# Patient Record
Sex: Male | Born: 1940 | Race: Black or African American | Hispanic: No | Marital: Married | Smoking: Former smoker
Health system: Southern US, Community
[De-identification: ages and names within clinical notes are randomized; demographics above are authoritative.]

## PROBLEM LIST (undated history)

## (undated) DIAGNOSIS — I10 Essential (primary) hypertension: Secondary | ICD-10-CM

---

## 1999-05-31 ENCOUNTER — Other Ambulatory Visit: Admission: RE | Admit: 1999-05-31 | Discharge: 1999-05-31 | Payer: Self-pay | Admitting: Urology

## 1999-06-22 ENCOUNTER — Encounter: Payer: Self-pay | Admitting: Urology

## 1999-06-25 ENCOUNTER — Encounter (INDEPENDENT_AMBULATORY_CARE_PROVIDER_SITE_OTHER): Payer: Self-pay

## 1999-06-25 ENCOUNTER — Inpatient Hospital Stay (HOSPITAL_COMMUNITY): Admission: RE | Admit: 1999-06-25 | Discharge: 1999-06-28 | Payer: Self-pay | Admitting: Urology

## 2003-07-10 ENCOUNTER — Emergency Department (HOSPITAL_COMMUNITY): Admission: EM | Admit: 2003-07-10 | Discharge: 2003-07-10 | Payer: Self-pay | Admitting: Emergency Medicine

## 2005-05-05 ENCOUNTER — Emergency Department (HOSPITAL_COMMUNITY): Admission: EM | Admit: 2005-05-05 | Discharge: 2005-05-05 | Payer: Self-pay | Admitting: Emergency Medicine

## 2007-07-09 ENCOUNTER — Encounter: Admission: RE | Admit: 2007-07-09 | Discharge: 2007-07-09 | Payer: Self-pay | Admitting: Internal Medicine

## 2009-08-20 ENCOUNTER — Emergency Department (HOSPITAL_COMMUNITY): Admission: EM | Admit: 2009-08-20 | Discharge: 2009-08-20 | Payer: Self-pay | Admitting: Emergency Medicine

## 2013-03-10 ENCOUNTER — Other Ambulatory Visit: Payer: Self-pay | Admitting: Internal Medicine

## 2013-03-10 DIAGNOSIS — Z136 Encounter for screening for cardiovascular disorders: Secondary | ICD-10-CM

## 2013-03-17 ENCOUNTER — Ambulatory Visit
Admission: RE | Admit: 2013-03-17 | Discharge: 2013-03-17 | Disposition: A | Discharge status: Home / Self Care | Payer: Medicare Other | Source: Ambulatory Visit | Attending: Internal Medicine | Admitting: Internal Medicine

## 2013-03-17 DIAGNOSIS — I10 Essential (primary) hypertension: Secondary | ICD-10-CM

## 2013-03-17 DIAGNOSIS — Z136 Encounter for screening for cardiovascular disorders: Secondary | ICD-10-CM

## 2013-03-17 HISTORY — DX: Essential (primary) hypertension: I10

## 2014-03-29 ENCOUNTER — Other Ambulatory Visit: Payer: Self-pay | Admitting: Internal Medicine

## 2014-03-29 DIAGNOSIS — R413 Other amnesia: Secondary | ICD-10-CM

## 2014-04-27 ENCOUNTER — Other Ambulatory Visit: Payer: Medicare Other

## 2014-05-06 ENCOUNTER — Ambulatory Visit
Admission: RE | Admit: 2014-05-06 | Discharge: 2014-05-06 | Disposition: A | Discharge status: Home / Self Care | Payer: Medicare Other | Source: Ambulatory Visit | Attending: Internal Medicine | Admitting: Internal Medicine

## 2014-05-06 DIAGNOSIS — R413 Other amnesia: Secondary | ICD-10-CM

## 2014-11-03 ENCOUNTER — Other Ambulatory Visit (HOSPITAL_COMMUNITY): Payer: Self-pay | Admitting: Internal Medicine

## 2014-11-03 DIAGNOSIS — I639 Cerebral infarction, unspecified: Secondary | ICD-10-CM

## 2014-11-08 ENCOUNTER — Ambulatory Visit (HOSPITAL_BASED_OUTPATIENT_CLINIC_OR_DEPARTMENT_OTHER)
Admission: RE | Admit: 2014-11-08 | Discharge: 2014-11-08 | Disposition: A | Discharge status: Home / Self Care | Payer: Medicare Other | Source: Ambulatory Visit | Attending: Internal Medicine | Admitting: Internal Medicine

## 2014-11-08 ENCOUNTER — Ambulatory Visit (HOSPITAL_COMMUNITY)
Admission: RE | Admit: 2014-11-08 | Discharge: 2014-11-08 | Disposition: A | Discharge status: Home / Self Care | Payer: Medicare Other | Source: Ambulatory Visit | Attending: Internal Medicine | Admitting: Internal Medicine

## 2014-11-08 DIAGNOSIS — I639 Cerebral infarction, unspecified: Secondary | ICD-10-CM

## 2014-11-08 DIAGNOSIS — Z8673 Personal history of transient ischemic attack (TIA), and cerebral infarction without residual deficits: Secondary | ICD-10-CM | POA: Diagnosis not present

## 2014-11-08 DIAGNOSIS — I10 Essential (primary) hypertension: Secondary | ICD-10-CM | POA: Diagnosis not present

## 2014-11-08 NOTE — Progress Notes (Signed)
VASCULAR LAB PRELIMINARY  PRELIMINARY  PRELIMINARY  PRELIMINARY  Carotid Duplex completed.    Preliminary report:  Bilateral:  1-39% ICA stenosis.  Vertebral artery flow is antegrade.    Chauncy Lean, RVT 11/08/2014, 2:43 PM

## 2014-11-08 NOTE — Progress Notes (Signed)
*  PRELIMINARY RESULTS* Echocardiogram 2D Echocardiogram has been performed.  Jerry Spencer 11/08/2014, 2:06 PM

## 2016-02-06 ENCOUNTER — Emergency Department (HOSPITAL_COMMUNITY)
Admission: EM | Admit: 2016-02-06 | Discharge: 2016-02-06 | Disposition: A | Discharge status: Home / Self Care | Payer: Medicare Other | Attending: Emergency Medicine | Admitting: Emergency Medicine

## 2016-02-06 ENCOUNTER — Encounter (HOSPITAL_COMMUNITY): Payer: Self-pay

## 2016-02-06 DIAGNOSIS — S39012A Strain of muscle, fascia and tendon of lower back, initial encounter: Secondary | ICD-10-CM | POA: Diagnosis not present

## 2016-02-06 DIAGNOSIS — Z87891 Personal history of nicotine dependence: Secondary | ICD-10-CM | POA: Insufficient documentation

## 2016-02-06 DIAGNOSIS — Z8546 Personal history of malignant neoplasm of prostate: Secondary | ICD-10-CM | POA: Insufficient documentation

## 2016-02-06 DIAGNOSIS — Y999 Unspecified external cause status: Secondary | ICD-10-CM | POA: Insufficient documentation

## 2016-02-06 DIAGNOSIS — Z7982 Long term (current) use of aspirin: Secondary | ICD-10-CM | POA: Insufficient documentation

## 2016-02-06 DIAGNOSIS — Y9389 Activity, other specified: Secondary | ICD-10-CM | POA: Insufficient documentation

## 2016-02-06 DIAGNOSIS — Y929 Unspecified place or not applicable: Secondary | ICD-10-CM | POA: Insufficient documentation

## 2016-02-06 DIAGNOSIS — X500XXA Overexertion from strenuous movement or load, initial encounter: Secondary | ICD-10-CM | POA: Insufficient documentation

## 2016-02-06 DIAGNOSIS — Z79899 Other long term (current) drug therapy: Secondary | ICD-10-CM | POA: Insufficient documentation

## 2016-02-06 DIAGNOSIS — I10 Essential (primary) hypertension: Secondary | ICD-10-CM | POA: Diagnosis not present

## 2016-02-06 DIAGNOSIS — S3992XA Unspecified injury of lower back, initial encounter: Secondary | ICD-10-CM | POA: Diagnosis present

## 2016-02-06 MED ORDER — NAPROXEN 500 MG PO TABS: 500.0000 mg | ORAL_TABLET | Freq: Two times a day (BID) | ORAL | 0 refills | Status: AC

## 2016-02-06 NOTE — ED Triage Notes (Signed)
Pt says he climbed three stories of steps on Saturday and then his lower back started to hurt

## 2016-02-06 NOTE — ED Provider Notes (Signed)
[facility]-EMERGENCY DEPT Provider Note   CSN: [id number] Arrival date & time: 02/06/16  1928   By signing my name below, I, Jerry Spencer, attest that this documentation has been prepared under the direction and in the presence of Jerry ReamAlexandra Rasean Joos, PA-C. Electronically Signed: Christel MormonMatthew Spencer, Scribe. 02/06/2016. 8:28 PM.   History   Chief Complaint Chief Complaint  Patient presents with  . Back Pain   The history is provided by the patient. No language interpreter was used.   HPI Comments:  Jerry Spencer is a 75 y.o. male with PMHx of HTN and prostate cancer 10 years ago who presents to the Emergency Department complaining of constant L-sided lower back pain x3 days. Pt states that he climbed 3 flights of stairs carrying heavy water and then the pain began the next morning. Pt rates the pain at 5/10 and describes it as "soreness." He has had some pain shooting down his left leg, only with movement. Pt has not taken anything for relief. Pt denies numbness, tingling, fever, bowel/bladder incontinence, saddle anesthesia, chest pain, SOB, nausea, vomiting, dysuria, or other urinary symptoms.   Past Medical History:  Diagnosis Date  . HTN (hypertension)     Patient Active Problem List   Diagnosis Date Noted  . HTN (hypertension)     History reviewed. No pertinent surgical history.     Home Medications    Prior to Admission medications   Medication Sig Start Date End Date Taking? Authorizing Provider  aspirin 81 MG tablet Take 81 mg by mouth daily.    Historical Provider, MD  chlorthalidone (HYGROTON) 25 MG tablet Take 25 mg by mouth daily.    Historical Provider, MD  naproxen (NAPROSYN) 500 MG tablet Take 1 tablet (500 mg total) by mouth 2 (two) times daily. 02/06/16 02/10/16  Waylan BogaAlexandra M [name], PA-C  potassium chloride (KLOR-CON) 20 MEQ packet Take 20 mEq by mouth 2 (two) times daily.    Historical Provider, MD    Family History History reviewed. No pertinent family  history.  Social History Social History  Substance Use Topics  . Smoking status: Former Smoker    Packs/day: 0.50    Years: 10.00    Quit date: 05/20/1973  . Smokeless tobacco: Never Used     Comment: Quit smoking habit 1975, still smokes occasionally  . Alcohol use No     Allergies   Review of patient's allergies indicates not on file.   Review of Systems Review of Systems  Constitutional: Negative for chills and fever.  HENT: Negative for facial swelling and sore throat.   Respiratory: Negative for shortness of breath.   Cardiovascular: Negative for chest pain.  Gastrointestinal: Negative for abdominal pain, nausea and vomiting.  Genitourinary: Negative for dysuria.  Musculoskeletal: Positive for back pain.  Skin: Negative for rash and wound.  Neurological: Negative for headaches.  Psychiatric/Behavioral: The patient is not nervous/anxious.      Physical Exam Updated Vital Signs BP 143/73 (BP Location: Right Arm)   Pulse 62   Temp 98.3 F (36.8 C) (Oral)   Resp 16   SpO2 100%   Physical Exam  Constitutional: He appears well-developed and well-nourished. No distress.  HENT:  Head: Normocephalic and atraumatic.  Mouth/Throat: Oropharynx is clear and moist. No oropharyngeal exudate.  Eyes: Conjunctivae are normal. Pupils are equal, round, and reactive to light. Right eye exhibits no discharge. Left eye exhibits no discharge. No scleral icterus.  Neck: Normal range of motion. Neck supple. No thyromegaly present.  Cardiovascular: Normal  rate, regular rhythm, normal heart sounds and intact distal pulses.  Exam reveals no gallop and no friction rub.   No murmur heard. Pulmonary/Chest: Effort normal and breath sounds normal. No stridor. No respiratory distress. He has no wheezes. He has no rales.  Abdominal: Soft. Bowel sounds are normal. He exhibits no distension and no pulsatile midline mass. There is no tenderness. There is no rebound, no guarding and no CVA  tenderness.  Musculoskeletal: He exhibits no edema.       Lumbar back: He exhibits tenderness and spasm. He exhibits no bony tenderness.       Back:  No tenderness to palpation to spine  Lymphadenopathy:    He has no cervical adenopathy.  Neurological: He is alert. Coordination normal.  Reflex Scores:      Patellar reflexes are 2+ on the right side and 2+ on the left side. 5/5 strength to lower extremities; normal sensation; patient ambulatory without difficulty in the ED  Skin: Skin is warm and dry. No rash noted. He is not diaphoretic. No pallor.  Psychiatric: He has a normal mood and affect.  Nursing note and vitals reviewed.    ED Treatments / Results  DIAGNOSTIC STUDIES:  Oxygen Saturation is 100% on RA, normal by my interpretation.    COORDINATION OF CARE:  8:28 PM Discussed treatment plan with pt at bedside and pt agreed to plan.   Labs (all labs ordered are listed, but only abnormal results are displayed) Labs Reviewed - No data to display  EKG  EKG Interpretation None       Radiology No results found.  Procedures Procedures (including critical care time)  Medications Ordered in ED Medications - No data to display   Initial Impression / Assessment and Plan / ED Course  I have reviewed the triage vital signs and the nursing notes.  Pertinent labs & imaging results that were available during my care of the patient were reviewed by me and considered in my medical decision making (see chart for details).  Clinical Course    Patient with back pain after mechanism of injury.  No neurological deficits and normal neuro exam.  Patient is ambulatory.  No loss of bowel or bladder control.  No concern for cauda equina.  No fever, night sweats, weight loss, h/o cancer, IVDA, no recent procedure to back. No urinary symptoms suggestive of UTI.  Patient discharged with short course of Naprosyn. Supportive care, including heat and stretches, and return precaution  discussed. Patient to follow up with PCP. Appears safe for discharge at this time. Patient also evaluated by Dr. [name] who agrees with plan.  Final Clinical Impressions(s) / ED Diagnoses   Final diagnoses:  Lumbar strain, initial encounter    New Prescriptions Discharge Medication List as of 02/06/2016  9:02 PM    START taking these medications   Details  naproxen (NAPROSYN) 500 MG tablet Take 1 tablet (500 mg total) by mouth 2 (two) times daily., Starting Tue 02/06/2016, Until Sat 02/10/2016, Print      I personally performed the services described in this documentation, which was scribed in my presence. The recorded information has been reviewed and is accurate.     Emi Holes, PA-C 02/06/16 2207    Lyndal Pulley, MD 02/07/16 1322

## 2016-02-06 NOTE — ED Triage Notes (Signed)
Pt complains of left side pain since last night, no trouble urinating, and no injury

## 2016-02-06 NOTE — Discharge Instructions (Signed)
Medications: Naprosyn  Treatment: Take Naprosyn as prescribed for 4 days. Use moist heat 3-4 times daily alternating 20 minutes on, 20 minutes off.  Follow-up: Please follow-up with your primary care provider for follow-up of today's visit and further evaluation and treatment of your symptoms as needed. Please return to emergency department if you develop any new or worsening symptoms.

## 2016-02-06 NOTE — Progress Notes (Signed)
Pt states he lifted a container of water for his daughter and carried it up 30 stairs. He then started to have low back pain and pain and pain shooting down his left leg.Pain is a 5/10.

## 2018-05-06 ENCOUNTER — Encounter: Payer: Self-pay | Admitting: Neurology

## 2018-07-22 ENCOUNTER — Other Ambulatory Visit: Payer: Self-pay

## 2018-07-22 ENCOUNTER — Ambulatory Visit: Payer: Medicare Other | Admitting: Neurology

## 2018-07-22 ENCOUNTER — Encounter: Payer: Self-pay | Admitting: Neurology

## 2018-07-22 VITALS — BP 94/68 | HR 64 | Ht 70.0 in | Wt 215.0 lb

## 2018-07-22 DIAGNOSIS — F0390 Unspecified dementia without behavioral disturbance: Secondary | ICD-10-CM | POA: Diagnosis not present

## 2018-07-22 DIAGNOSIS — R292 Abnormal reflex: Secondary | ICD-10-CM | POA: Diagnosis not present

## 2018-07-22 DIAGNOSIS — F0391 Unspecified dementia with behavioral disturbance: Secondary | ICD-10-CM | POA: Diagnosis not present

## 2018-07-22 DIAGNOSIS — R413 Other amnesia: Secondary | ICD-10-CM

## 2018-07-22 DIAGNOSIS — F03918 Unspecified dementia, unspecified severity, with other behavioral disturbance: Secondary | ICD-10-CM

## 2018-07-22 DIAGNOSIS — F03A0 Unspecified dementia, mild, without behavioral disturbance, psychotic disturbance, mood disturbance, and anxiety: Secondary | ICD-10-CM

## 2018-07-22 NOTE — Patient Instructions (Addendum)
1. Schedule MRI brain without contrast  We have sent a referral to [facility] for your MRI and they will call you directly to schedule your appt. They are located at [address] [facility]. If you need to contact them directly please call [phone].  2. Refer to Dr. Jacquelyne Balint for repeat Neurocognitive testing (prior testing in 2017) 3. Continue all your medications and have your wife help 4. Continue to monitor driving 5. Follow-up in 6 months  FALL PRECAUTIONS: Be cautious when walking. Scan the area for obstacles that may increase the risk of trips and falls. When getting up in the mornings, sit up at the edge of the bed for a few minutes before getting out of bed. Consider elevating the bed at the head end to avoid drop of blood pressure when getting up. Walk always in a well-lit room (use night lights in the walls). Avoid area rugs or power cords from appliances in the middle of the walkways. Use a walker or a cane if necessary and consider physical therapy for balance exercise. Get your eyesight checked regularly.  HOME SAFETY: Consider the safety of the kitchen when operating appliances like stoves, microwave oven, and blender. Consider having supervision and share cooking responsibilities until no longer able to participate in those. Accidents with firearms and other hazards in the house should be identified and addressed as well.  DRIVING: Regarding driving, in patients with progressive memory problems, driving will be impaired. We advise to have someone else do the driving if trouble finding directions or if minor accidents are reported. Independent driving assessment is available to determine safety of driving.  ABILITY TO BE LEFT ALONE: If patient is unable to contact 911 operator, consider using LifeLine, or when the need is there, arrange for someone to stay with patients. Smoking is a fire hazard, consider supervision or cessation. Risk of wandering should be assessed by  caregiver and if detected at any point, supervision and safe proof recommendations should be instituted.  MEDICATION SUPERVISION: Inability to self-administer medication needs to be constantly addressed. Implement a mechanism to ensure safe administration of the medications.  RECOMMENDATIONS FOR ALL PATIENTS WITH MEMORY PROBLEMS: 1. Continue to exercise (Recommend 30 minutes of walking everyday, or 3 hours every week) 2. Increase social interactions - continue going to [facility] and enjoy social gatherings with friends and family 3. Eat healthy, avoid fried foods and eat more fruits and vegetables 4. Maintain adequate blood pressure, blood sugar, and blood cholesterol level. Reducing the risk of stroke and cardiovascular disease also helps promoting better memory. 5. Avoid stressful situations. Live a simple life and avoid aggravations. Organize your time and prepare for the next day in anticipation. 6. Sleep well, avoid any interruptions of sleep and avoid any distractions in the bedroom that may interfere with adequate sleep quality 7. Avoid sugar, avoid sweets as there is a strong link between excessive sugar intake, diabetes, and cognitive impairment The Mediterranean diet has been shown to help patients reduce the risk of progressive memory disorders and reduces cardiovascular risk. This includes eating fish, eat fruits and green leafy vegetables, nuts like almonds and hazelnuts, walnuts, and also use olive oil. Avoid fast foods and fried foods as much as possible. Avoid sweets and sugar as sugar use has been linked to worsening of memory function.  There is always a concern of gradual progression of memory problems. If this is the case, then we may need to adjust level of care according to patient needs. Support, both  to the patient and caregiver, should then be put into place.
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
## 2018-07-28 ENCOUNTER — Ambulatory Visit
Admission: RE | Admit: 2018-07-28 | Discharge: 2018-07-28 | Disposition: A | Discharge status: Home / Self Care | Payer: Medicare Other | Source: Ambulatory Visit | Attending: Neurology | Admitting: Neurology

## 2018-07-28 DIAGNOSIS — R413 Other amnesia: Secondary | ICD-10-CM

## 2018-07-29 ENCOUNTER — Telehealth: Payer: Self-pay

## 2018-07-29 NOTE — Telephone Encounter (Signed)
-----   Message from Van Clines, MD sent at 07/28/2018  2:02 PM EDT ----- Pls let patient/wife know the MRI did not show any evidence of tumor, stroke, or bleed. It showed age-related changes. Pls fax report to his PCP as well, thanks

## 2018-07-29 NOTE — Telephone Encounter (Signed)
Patient left vm with afterhours about returning your call. Thanks

## 2018-07-29 NOTE — Telephone Encounter (Signed)
LMOM asking for return call to relay results below.    MRI report faxed to Dr. Valentina Lucks at [phone]

## 2018-07-30 NOTE — Telephone Encounter (Signed)
Returned call to pt.  Relayed results below.

## 2019-02-09 ENCOUNTER — Ambulatory Visit: Payer: Medicare Other | Admitting: Neurology

## 2019-02-26 ENCOUNTER — Ambulatory Visit: Payer: Medicare Other | Admitting: Neurology

## 2019-02-26 ENCOUNTER — Encounter: Payer: Self-pay | Admitting: Neurology

## 2019-02-26 ENCOUNTER — Other Ambulatory Visit: Payer: Self-pay

## 2019-02-26 VITALS — BP 126/60 | HR 72 | Ht 70.0 in | Wt 219.4 lb

## 2019-02-26 DIAGNOSIS — F03B18 Unspecified dementia, moderate, with other behavioral disturbance: Secondary | ICD-10-CM

## 2019-02-26 DIAGNOSIS — F0391 Unspecified dementia with behavioral disturbance: Secondary | ICD-10-CM | POA: Diagnosis not present

## 2019-02-26 MED ORDER — MEMANTINE HCL 10 MG PO TABS: ORAL_TABLET | ORAL | 11 refills | Status: DC

## 2019-02-26 MED ORDER — DONEPEZIL HCL 10 MG PO TABS: ORAL_TABLET | ORAL | 3 refills | Status: AC

## 2019-02-26 NOTE — Patient Instructions (Signed)
1. Start Namenda 10mg : take 1 tablet every night for 2 weeks, then increase to 1 tablet twice a day  2. Continue Aricept 10mg  daily  3. Recommend increasing supervision at home and getting more help at home  4. Follow-up in 6 months, call for any changes  FALL PRECAUTIONS: Be cautious when walking. Scan the area for obstacles that may increase the risk of trips and falls. When getting up in the mornings, sit up at the edge of the bed for a few minutes before getting out of bed. Consider elevating the bed at the head end to avoid drop of blood pressure when getting up. Walk always in a well-lit room (use night lights in the walls). Avoid area rugs or power cords from appliances in the middle of the walkways. Use a walker or a cane if necessary and consider physical therapy for balance exercise. Get your eyesight checked regularly.  FINANCIAL OVERSIGHT: Supervision, especially oversight when making financial decisions or transactions is also recommended.  HOME SAFETY: Consider the safety of the kitchen when operating appliances like stoves, microwave oven, and blender. Consider having supervision and share cooking responsibilities until no longer able to participate in those. Accidents with firearms and other hazards in the house should be identified and addressed as well.  DRIVING: Regarding driving, in patients with progressive memory problems, driving will be impaired. We advise to have someone else do the driving if trouble finding directions or if minor accidents are reported. Independent driving assessment is available to determine safety of driving.  ABILITY TO BE LEFT ALONE: If patient is unable to contact 911 operator, consider using LifeLine, or when the need is there, arrange for someone to stay with patients. Smoking is a fire hazard, consider supervision or cessation. Risk of wandering should be assessed by caregiver and if detected at any point, supervision and safe proof recommendations  should be instituted.  MEDICATION SUPERVISION: Inability to self-administer medication needs to be constantly addressed. Implement a mechanism to ensure safe administration of the medications.  RECOMMENDATIONS FOR ALL PATIENTS WITH MEMORY PROBLEMS: 1. Continue to exercise (Recommend 30 minutes of walking everyday, or 3 hours every week) 2. Increase social interactions - continue going to [place] and enjoy social gatherings with friends and family 3. Eat healthy, avoid fried foods and eat more fruits and vegetables 4. Maintain adequate blood pressure, blood sugar, and blood cholesterol level. Reducing the risk of stroke and cardiovascular disease also helps promoting better memory. 5. Avoid stressful situations. Live a simple life and avoid aggravations. Organize your time and prepare for the next day in anticipation. 6. Sleep well, avoid any interruptions of sleep and avoid any distractions in the bedroom that may interfere with adequate sleep quality 7. Avoid sugar, avoid sweets as there is a strong link between excessive sugar intake, diabetes, and cognitive impairment The Mediterranean diet has been shown to help patients reduce the risk of progressive memory disorders and reduces cardiovascular risk. This includes eating fish, eat fruits and green leafy vegetables, nuts like almonds and hazelnuts, walnuts, and also use olive oil. Avoid fast foods and fried foods as much as possible. Avoid sweets and sugar as sugar use has been linked to worsening of memory function.  There is always a concern of gradual progression of memory problems. If this is the case, then we may need to adjust level of care according to patient needs. Support, both to the patient and caregiver, should then be put into place.
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
## 2019-03-22 ENCOUNTER — Other Ambulatory Visit: Payer: Self-pay | Admitting: Neurology

## 2019-09-21 ENCOUNTER — Ambulatory Visit: Payer: Medicare Other | Admitting: Neurology

## 2020-03-01 ENCOUNTER — Other Ambulatory Visit: Payer: Self-pay | Admitting: Internal Medicine

## 2020-03-01 DIAGNOSIS — N1831 Chronic kidney disease, stage 3a: Secondary | ICD-10-CM

## 2020-03-09 ENCOUNTER — Other Ambulatory Visit: Payer: Self-pay

## 2020-03-09 ENCOUNTER — Ambulatory Visit
Admission: RE | Admit: 2020-03-09 | Discharge: 2020-03-09 | Disposition: A | Discharge status: Home / Self Care | Payer: Medicare Other | Source: Ambulatory Visit | Attending: Internal Medicine | Admitting: Internal Medicine

## 2020-03-09 ENCOUNTER — Other Ambulatory Visit: Payer: Self-pay | Admitting: Internal Medicine

## 2020-03-09 DIAGNOSIS — N1831 Chronic kidney disease, stage 3a: Secondary | ICD-10-CM

## 2020-05-31 DIAGNOSIS — N183 Chronic kidney disease, stage 3 unspecified: Secondary | ICD-10-CM | POA: Diagnosis not present

## 2020-05-31 DIAGNOSIS — E7800 Pure hypercholesterolemia, unspecified: Secondary | ICD-10-CM | POA: Diagnosis not present

## 2020-05-31 DIAGNOSIS — I129 Hypertensive chronic kidney disease with stage 1 through stage 4 chronic kidney disease, or unspecified chronic kidney disease: Secondary | ICD-10-CM | POA: Diagnosis not present

## 2020-05-31 DIAGNOSIS — K219 Gastro-esophageal reflux disease without esophagitis: Secondary | ICD-10-CM | POA: Diagnosis not present

## 2020-05-31 DIAGNOSIS — E1122 Type 2 diabetes mellitus with diabetic chronic kidney disease: Secondary | ICD-10-CM | POA: Diagnosis not present

## 2020-05-31 DIAGNOSIS — I10 Essential (primary) hypertension: Secondary | ICD-10-CM | POA: Diagnosis not present

## 2020-05-31 DIAGNOSIS — G301 Alzheimer's disease with late onset: Secondary | ICD-10-CM | POA: Diagnosis not present

## 2020-05-31 DIAGNOSIS — N1831 Chronic kidney disease, stage 3a: Secondary | ICD-10-CM | POA: Diagnosis not present

## 2020-06-21 DIAGNOSIS — E7800 Pure hypercholesterolemia, unspecified: Secondary | ICD-10-CM | POA: Diagnosis not present

## 2020-06-21 DIAGNOSIS — K219 Gastro-esophageal reflux disease without esophagitis: Secondary | ICD-10-CM | POA: Diagnosis not present

## 2020-06-21 DIAGNOSIS — I10 Essential (primary) hypertension: Secondary | ICD-10-CM | POA: Diagnosis not present

## 2020-06-21 DIAGNOSIS — E1122 Type 2 diabetes mellitus with diabetic chronic kidney disease: Secondary | ICD-10-CM | POA: Diagnosis not present

## 2020-06-21 DIAGNOSIS — I129 Hypertensive chronic kidney disease with stage 1 through stage 4 chronic kidney disease, or unspecified chronic kidney disease: Secondary | ICD-10-CM | POA: Diagnosis not present

## 2020-06-21 DIAGNOSIS — G301 Alzheimer's disease with late onset: Secondary | ICD-10-CM | POA: Diagnosis not present

## 2020-08-03 DIAGNOSIS — N183 Chronic kidney disease, stage 3 unspecified: Secondary | ICD-10-CM | POA: Diagnosis not present

## 2020-08-03 DIAGNOSIS — E1122 Type 2 diabetes mellitus with diabetic chronic kidney disease: Secondary | ICD-10-CM | POA: Diagnosis not present

## 2020-08-03 DIAGNOSIS — G301 Alzheimer's disease with late onset: Secondary | ICD-10-CM | POA: Diagnosis not present

## 2020-08-03 DIAGNOSIS — I129 Hypertensive chronic kidney disease with stage 1 through stage 4 chronic kidney disease, or unspecified chronic kidney disease: Secondary | ICD-10-CM | POA: Diagnosis not present

## 2020-08-03 DIAGNOSIS — K219 Gastro-esophageal reflux disease without esophagitis: Secondary | ICD-10-CM | POA: Diagnosis not present

## 2020-08-03 DIAGNOSIS — I10 Essential (primary) hypertension: Secondary | ICD-10-CM | POA: Diagnosis not present

## 2020-08-03 DIAGNOSIS — E7800 Pure hypercholesterolemia, unspecified: Secondary | ICD-10-CM | POA: Diagnosis not present

## 2020-08-03 DIAGNOSIS — N1831 Chronic kidney disease, stage 3a: Secondary | ICD-10-CM | POA: Diagnosis not present

## 2020-08-22 DIAGNOSIS — H524 Presbyopia: Secondary | ICD-10-CM | POA: Diagnosis not present

## 2020-08-22 DIAGNOSIS — H40013 Open angle with borderline findings, low risk, bilateral: Secondary | ICD-10-CM | POA: Diagnosis not present

## 2020-08-22 DIAGNOSIS — H2512 Age-related nuclear cataract, left eye: Secondary | ICD-10-CM | POA: Diagnosis not present

## 2020-08-22 DIAGNOSIS — H43813 Vitreous degeneration, bilateral: Secondary | ICD-10-CM | POA: Diagnosis not present

## 2020-08-23 DIAGNOSIS — I129 Hypertensive chronic kidney disease with stage 1 through stage 4 chronic kidney disease, or unspecified chronic kidney disease: Secondary | ICD-10-CM | POA: Diagnosis not present

## 2020-08-23 DIAGNOSIS — N1831 Chronic kidney disease, stage 3a: Secondary | ICD-10-CM | POA: Diagnosis not present

## 2020-08-23 DIAGNOSIS — E1122 Type 2 diabetes mellitus with diabetic chronic kidney disease: Secondary | ICD-10-CM | POA: Diagnosis not present

## 2020-08-23 DIAGNOSIS — M109 Gout, unspecified: Secondary | ICD-10-CM | POA: Diagnosis not present

## 2020-08-23 DIAGNOSIS — G301 Alzheimer's disease with late onset: Secondary | ICD-10-CM | POA: Diagnosis not present

## 2020-08-25 DIAGNOSIS — I129 Hypertensive chronic kidney disease with stage 1 through stage 4 chronic kidney disease, or unspecified chronic kidney disease: Secondary | ICD-10-CM | POA: Diagnosis not present

## 2020-08-25 DIAGNOSIS — I10 Essential (primary) hypertension: Secondary | ICD-10-CM | POA: Diagnosis not present

## 2020-08-25 DIAGNOSIS — E1122 Type 2 diabetes mellitus with diabetic chronic kidney disease: Secondary | ICD-10-CM | POA: Diagnosis not present

## 2020-08-25 DIAGNOSIS — N1831 Chronic kidney disease, stage 3a: Secondary | ICD-10-CM | POA: Diagnosis not present

## 2020-08-25 DIAGNOSIS — E7800 Pure hypercholesterolemia, unspecified: Secondary | ICD-10-CM | POA: Diagnosis not present

## 2020-08-25 DIAGNOSIS — G301 Alzheimer's disease with late onset: Secondary | ICD-10-CM | POA: Diagnosis not present

## 2020-08-25 DIAGNOSIS — K219 Gastro-esophageal reflux disease without esophagitis: Secondary | ICD-10-CM | POA: Diagnosis not present

## 2020-09-21 DIAGNOSIS — K219 Gastro-esophageal reflux disease without esophagitis: Secondary | ICD-10-CM | POA: Diagnosis not present

## 2020-09-21 DIAGNOSIS — E1122 Type 2 diabetes mellitus with diabetic chronic kidney disease: Secondary | ICD-10-CM | POA: Diagnosis not present

## 2020-09-21 DIAGNOSIS — E7800 Pure hypercholesterolemia, unspecified: Secondary | ICD-10-CM | POA: Diagnosis not present

## 2020-09-21 DIAGNOSIS — I129 Hypertensive chronic kidney disease with stage 1 through stage 4 chronic kidney disease, or unspecified chronic kidney disease: Secondary | ICD-10-CM | POA: Diagnosis not present

## 2020-09-21 DIAGNOSIS — I10 Essential (primary) hypertension: Secondary | ICD-10-CM | POA: Diagnosis not present

## 2020-09-21 DIAGNOSIS — N183 Chronic kidney disease, stage 3 unspecified: Secondary | ICD-10-CM | POA: Diagnosis not present

## 2020-09-21 DIAGNOSIS — N1831 Chronic kidney disease, stage 3a: Secondary | ICD-10-CM | POA: Diagnosis not present

## 2020-09-21 DIAGNOSIS — G301 Alzheimer's disease with late onset: Secondary | ICD-10-CM | POA: Diagnosis not present

## 2020-10-23 DIAGNOSIS — I129 Hypertensive chronic kidney disease with stage 1 through stage 4 chronic kidney disease, or unspecified chronic kidney disease: Secondary | ICD-10-CM | POA: Diagnosis not present

## 2020-10-23 DIAGNOSIS — I10 Essential (primary) hypertension: Secondary | ICD-10-CM | POA: Diagnosis not present

## 2020-10-23 DIAGNOSIS — N183 Chronic kidney disease, stage 3 unspecified: Secondary | ICD-10-CM | POA: Diagnosis not present

## 2020-10-23 DIAGNOSIS — E1122 Type 2 diabetes mellitus with diabetic chronic kidney disease: Secondary | ICD-10-CM | POA: Diagnosis not present

## 2020-10-23 DIAGNOSIS — G301 Alzheimer's disease with late onset: Secondary | ICD-10-CM | POA: Diagnosis not present

## 2020-10-23 DIAGNOSIS — E7800 Pure hypercholesterolemia, unspecified: Secondary | ICD-10-CM | POA: Diagnosis not present

## 2020-10-23 DIAGNOSIS — N1831 Chronic kidney disease, stage 3a: Secondary | ICD-10-CM | POA: Diagnosis not present

## 2020-10-23 DIAGNOSIS — K219 Gastro-esophageal reflux disease without esophagitis: Secondary | ICD-10-CM | POA: Diagnosis not present

## 2020-11-21 DIAGNOSIS — E1122 Type 2 diabetes mellitus with diabetic chronic kidney disease: Secondary | ICD-10-CM | POA: Diagnosis not present

## 2020-11-21 DIAGNOSIS — K219 Gastro-esophageal reflux disease without esophagitis: Secondary | ICD-10-CM | POA: Diagnosis not present

## 2020-11-21 DIAGNOSIS — N183 Chronic kidney disease, stage 3 unspecified: Secondary | ICD-10-CM | POA: Diagnosis not present

## 2020-11-21 DIAGNOSIS — I10 Essential (primary) hypertension: Secondary | ICD-10-CM | POA: Diagnosis not present

## 2020-11-21 DIAGNOSIS — I129 Hypertensive chronic kidney disease with stage 1 through stage 4 chronic kidney disease, or unspecified chronic kidney disease: Secondary | ICD-10-CM | POA: Diagnosis not present

## 2020-11-21 DIAGNOSIS — E7800 Pure hypercholesterolemia, unspecified: Secondary | ICD-10-CM | POA: Diagnosis not present

## 2020-11-21 DIAGNOSIS — G301 Alzheimer's disease with late onset: Secondary | ICD-10-CM | POA: Diagnosis not present

## 2020-12-20 DIAGNOSIS — E7800 Pure hypercholesterolemia, unspecified: Secondary | ICD-10-CM | POA: Diagnosis not present

## 2020-12-20 DIAGNOSIS — G301 Alzheimer's disease with late onset: Secondary | ICD-10-CM | POA: Diagnosis not present

## 2020-12-20 DIAGNOSIS — K219 Gastro-esophageal reflux disease without esophagitis: Secondary | ICD-10-CM | POA: Diagnosis not present

## 2020-12-20 DIAGNOSIS — N1831 Chronic kidney disease, stage 3a: Secondary | ICD-10-CM | POA: Diagnosis not present

## 2020-12-20 DIAGNOSIS — E1122 Type 2 diabetes mellitus with diabetic chronic kidney disease: Secondary | ICD-10-CM | POA: Diagnosis not present

## 2020-12-20 DIAGNOSIS — I129 Hypertensive chronic kidney disease with stage 1 through stage 4 chronic kidney disease, or unspecified chronic kidney disease: Secondary | ICD-10-CM | POA: Diagnosis not present

## 2020-12-20 DIAGNOSIS — I10 Essential (primary) hypertension: Secondary | ICD-10-CM | POA: Diagnosis not present

## 2021-02-16 DIAGNOSIS — I10 Essential (primary) hypertension: Secondary | ICD-10-CM | POA: Diagnosis not present

## 2021-02-16 DIAGNOSIS — E1122 Type 2 diabetes mellitus with diabetic chronic kidney disease: Secondary | ICD-10-CM | POA: Diagnosis not present

## 2021-02-16 DIAGNOSIS — K219 Gastro-esophageal reflux disease without esophagitis: Secondary | ICD-10-CM | POA: Diagnosis not present

## 2021-02-16 DIAGNOSIS — N1831 Chronic kidney disease, stage 3a: Secondary | ICD-10-CM | POA: Diagnosis not present

## 2021-02-16 DIAGNOSIS — G301 Alzheimer's disease with late onset: Secondary | ICD-10-CM | POA: Diagnosis not present

## 2021-02-16 DIAGNOSIS — I129 Hypertensive chronic kidney disease with stage 1 through stage 4 chronic kidney disease, or unspecified chronic kidney disease: Secondary | ICD-10-CM | POA: Diagnosis not present

## 2021-02-16 DIAGNOSIS — E7800 Pure hypercholesterolemia, unspecified: Secondary | ICD-10-CM | POA: Diagnosis not present

## 2021-03-19 DIAGNOSIS — I129 Hypertensive chronic kidney disease with stage 1 through stage 4 chronic kidney disease, or unspecified chronic kidney disease: Secondary | ICD-10-CM | POA: Diagnosis not present

## 2021-03-19 DIAGNOSIS — E1122 Type 2 diabetes mellitus with diabetic chronic kidney disease: Secondary | ICD-10-CM | POA: Diagnosis not present

## 2021-03-19 DIAGNOSIS — N183 Chronic kidney disease, stage 3 unspecified: Secondary | ICD-10-CM | POA: Diagnosis not present

## 2021-03-19 DIAGNOSIS — I10 Essential (primary) hypertension: Secondary | ICD-10-CM | POA: Diagnosis not present

## 2021-03-19 DIAGNOSIS — E7800 Pure hypercholesterolemia, unspecified: Secondary | ICD-10-CM | POA: Diagnosis not present

## 2021-03-19 DIAGNOSIS — K219 Gastro-esophageal reflux disease without esophagitis: Secondary | ICD-10-CM | POA: Diagnosis not present

## 2021-03-19 DIAGNOSIS — G301 Alzheimer's disease with late onset: Secondary | ICD-10-CM | POA: Diagnosis not present

## 2021-03-30 DIAGNOSIS — H40013 Open angle with borderline findings, low risk, bilateral: Secondary | ICD-10-CM | POA: Diagnosis not present

## 2021-04-03 IMAGING — US US RENAL
1 series · 14 of 25 positions shown · non-contrast
Comparison: None available.

CLINICAL DATA: Initial evaluation for stage III A chronic kidney
disease.

EXAM:
RENAL / URINARY TRACT ULTRASOUND COMPLETE

[Series 1: us renal · 0.23mm/px · 14 of 43 slices shown]
[im 1/43]
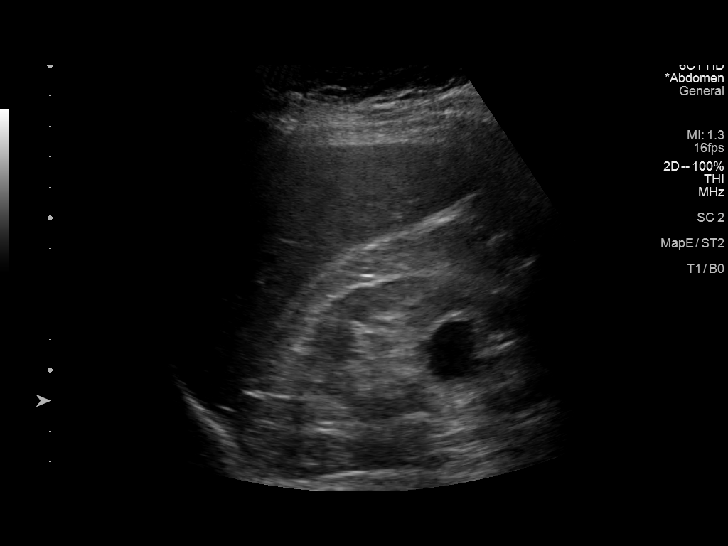
[im 4/43]
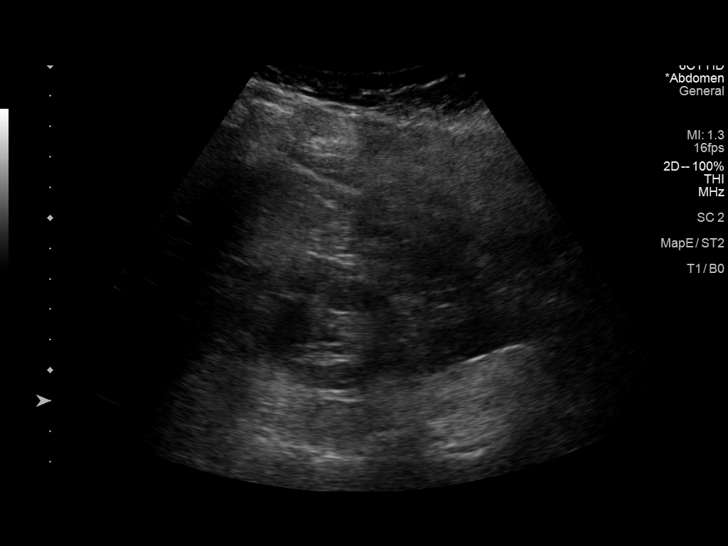
[im 8/43]
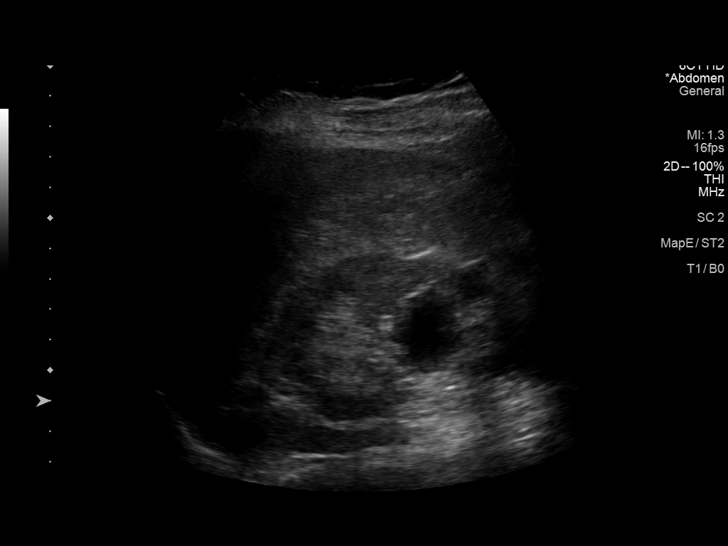
[im 11/43]
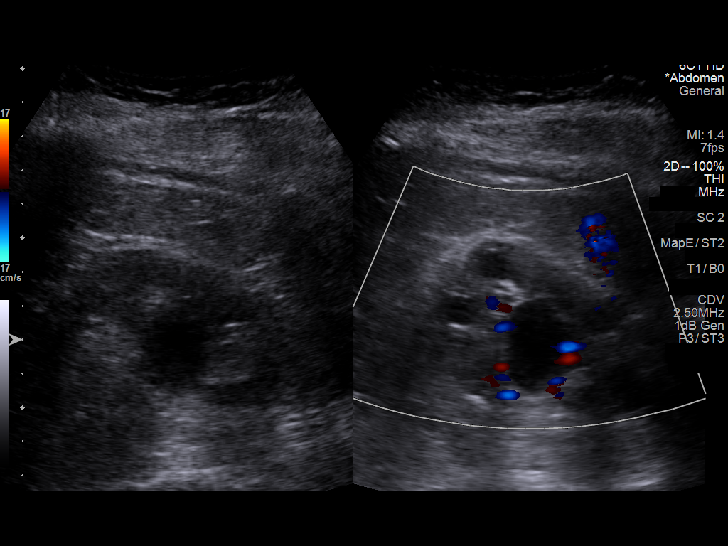
[im 15/43]
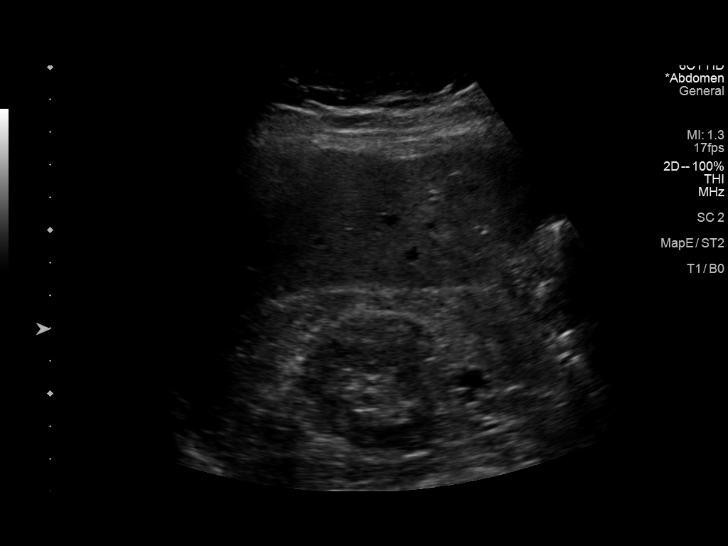
[im 16/43]
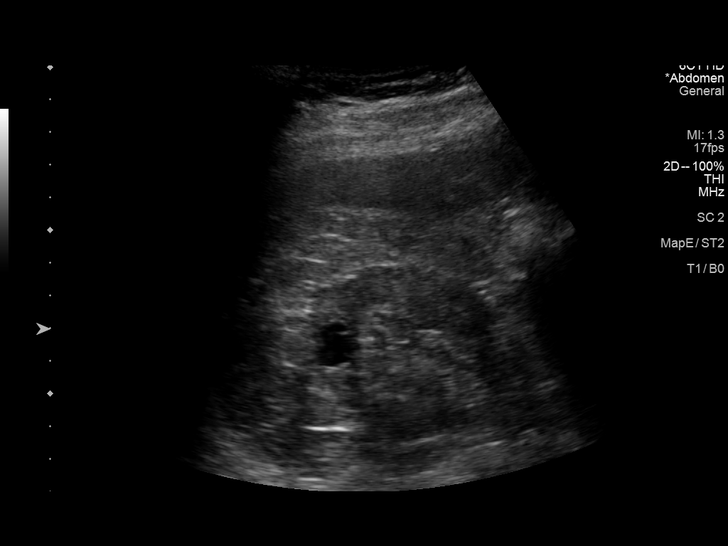
[im 20/43]
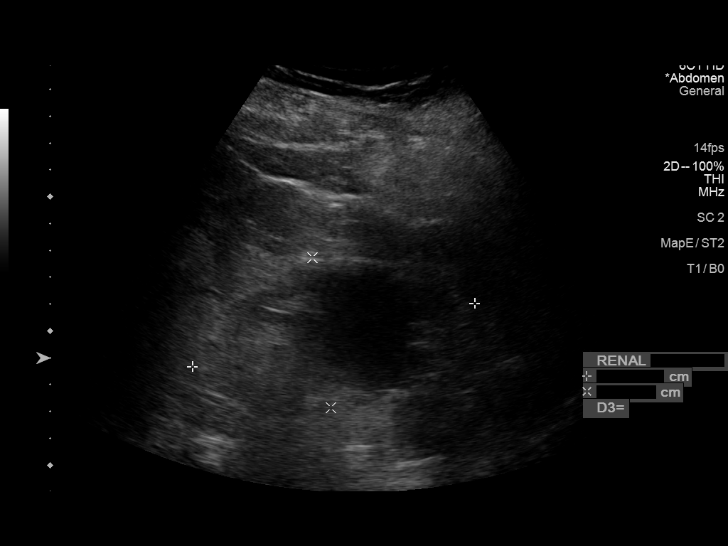
[im 23/43]
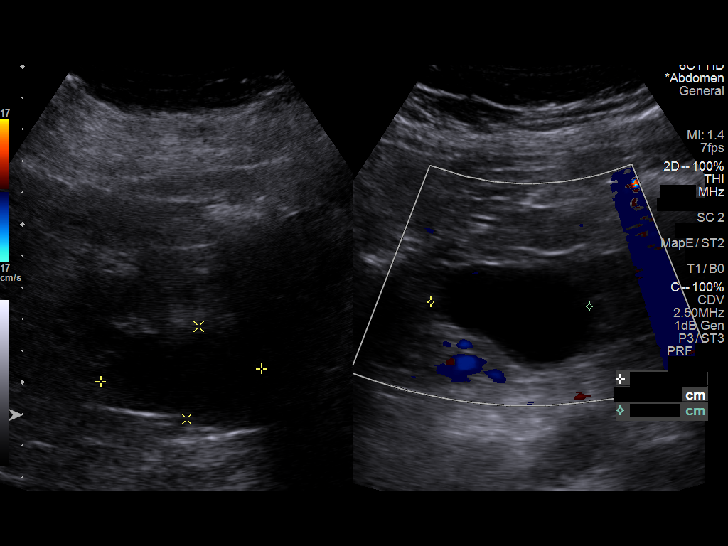
[im 27/43]
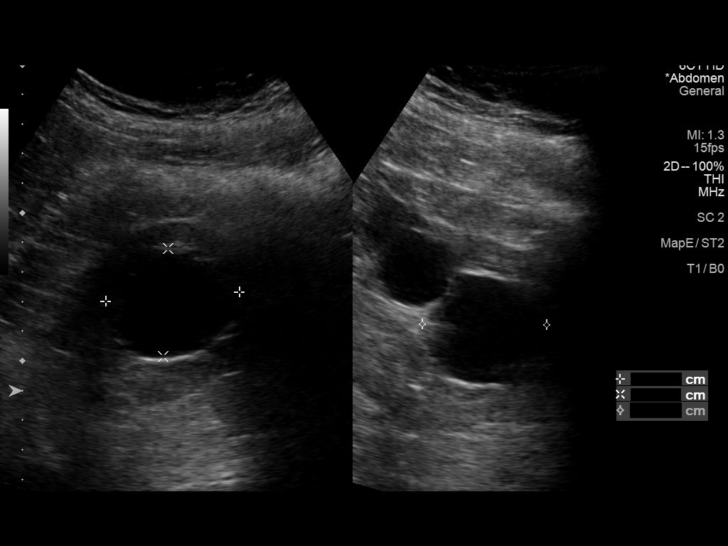
[im 29/43]
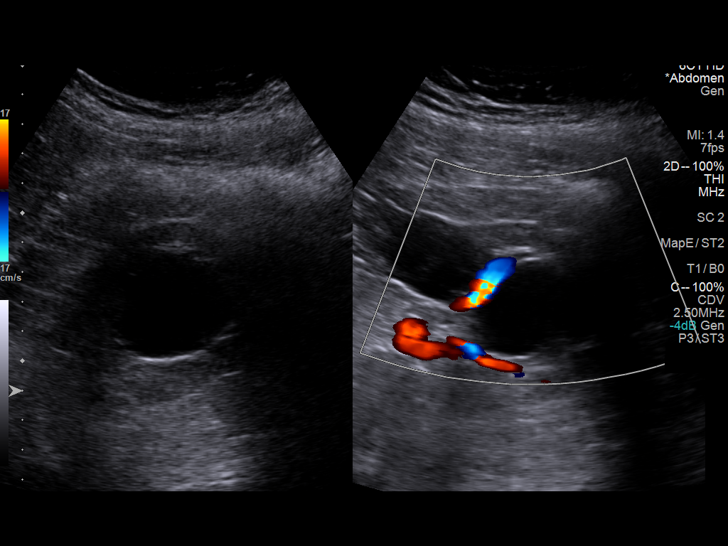
[im 32/43]
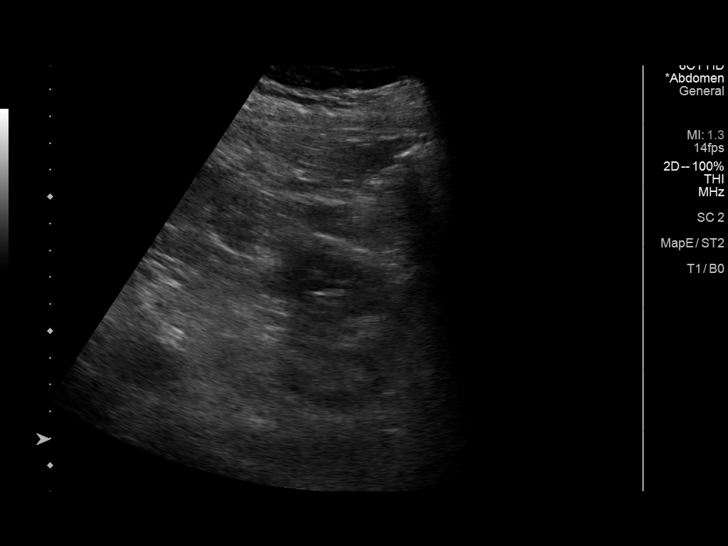
[im 36/43]
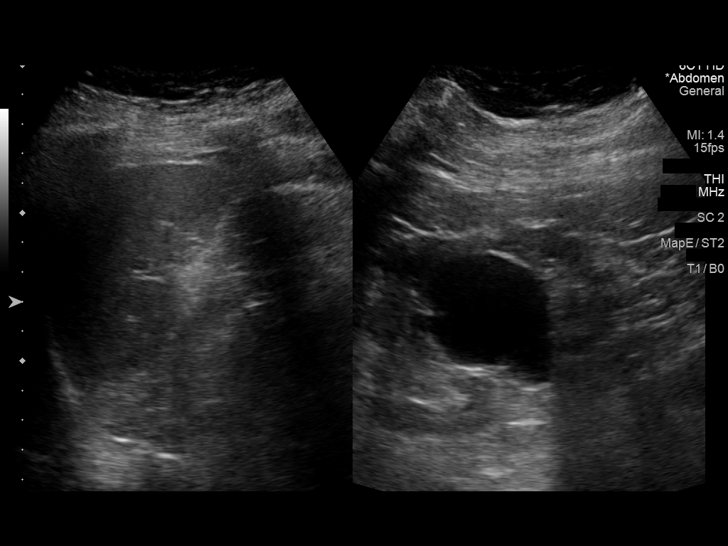
[im 39/43]
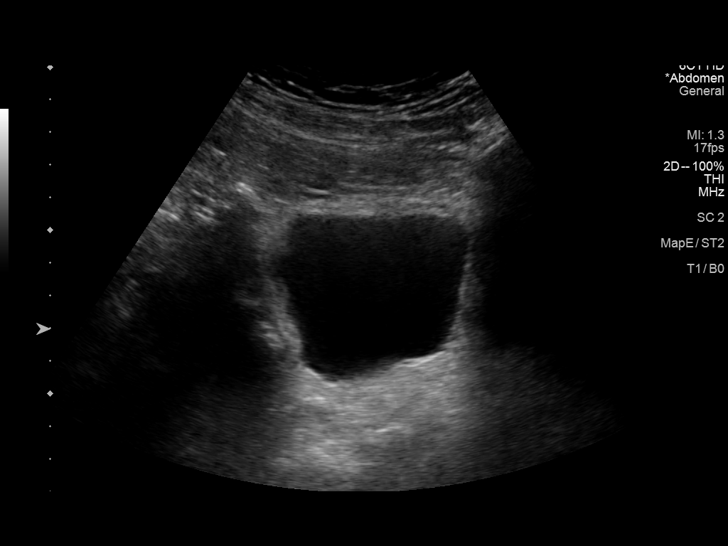
[im 43/43]
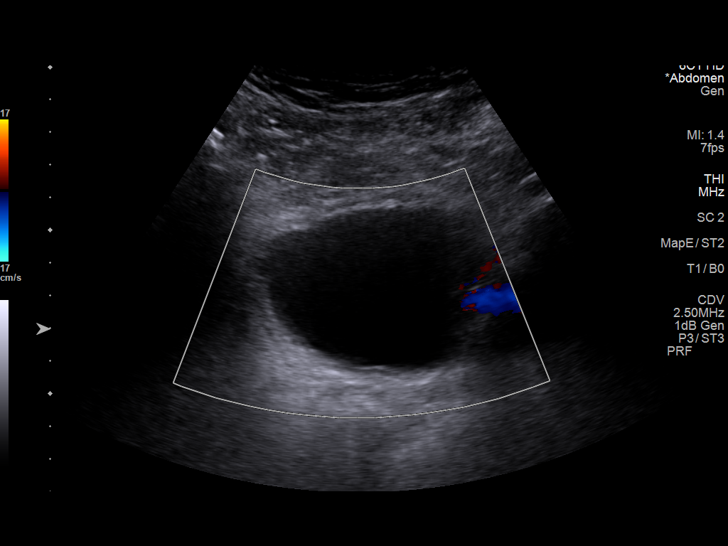

[14 of 25 positions shown; findings below may reference images not displayed]

FINDINGS: Right Kidney:

Renal measurements: 10.2 x 4.2 x 5.7 cm = volume: 127 mL. Increased
echogenicity within the renal parenchyma with mild diffuse cortical
thinning. No nephrolithiasis or hydronephrosis. 3.0 x 2.2 x 2.5 cm
simple cyst present at the lower pole. Additional 2.1 x 2.7 x 1.8 cm
simple cyst present at the interpolar region.

Left Kidney:

Renal measurements: 10.8 x 5.6 x 5.5 cm = volume: 176 mL. Increased
echogenicity within the renal parenchyma with mild diffuse cortical
thinning. No nephrolithiasis or hydronephrosis. 5.1 x 3.0 x 5.0 cm
simple cyst present at the lower pole. Additional 4.5 x 2.7 x 4.2 cm
simple cyst present at the interpolar region.

Bladder:

Appears normal for degree of bladder distention.

Other:

None.
IMPRESSION: 1. Increased echogenicity within the renal parenchyma with mild
diffuse cortical thinning, compatible with medical renal disease.
2. No hydronephrosis or other acute abnormality.
3. Simple bilateral renal cysts as above, measuring up to 5.1 cm on
the left.

## 2021-04-18 DIAGNOSIS — I129 Hypertensive chronic kidney disease with stage 1 through stage 4 chronic kidney disease, or unspecified chronic kidney disease: Secondary | ICD-10-CM | POA: Diagnosis not present

## 2021-04-18 DIAGNOSIS — N183 Chronic kidney disease, stage 3 unspecified: Secondary | ICD-10-CM | POA: Diagnosis not present

## 2021-04-18 DIAGNOSIS — E1122 Type 2 diabetes mellitus with diabetic chronic kidney disease: Secondary | ICD-10-CM | POA: Diagnosis not present

## 2021-04-18 DIAGNOSIS — K219 Gastro-esophageal reflux disease without esophagitis: Secondary | ICD-10-CM | POA: Diagnosis not present

## 2021-04-18 DIAGNOSIS — G301 Alzheimer's disease with late onset: Secondary | ICD-10-CM | POA: Diagnosis not present

## 2021-04-18 DIAGNOSIS — I10 Essential (primary) hypertension: Secondary | ICD-10-CM | POA: Diagnosis not present

## 2021-04-24 DIAGNOSIS — I129 Hypertensive chronic kidney disease with stage 1 through stage 4 chronic kidney disease, or unspecified chronic kidney disease: Secondary | ICD-10-CM | POA: Diagnosis not present

## 2021-04-24 DIAGNOSIS — R051 Acute cough: Secondary | ICD-10-CM | POA: Diagnosis not present

## 2021-04-24 DIAGNOSIS — E7800 Pure hypercholesterolemia, unspecified: Secondary | ICD-10-CM | POA: Diagnosis not present

## 2021-04-24 DIAGNOSIS — M109 Gout, unspecified: Secondary | ICD-10-CM | POA: Diagnosis not present

## 2021-04-24 DIAGNOSIS — Z23 Encounter for immunization: Secondary | ICD-10-CM | POA: Diagnosis not present

## 2021-04-24 DIAGNOSIS — G301 Alzheimer's disease with late onset: Secondary | ICD-10-CM | POA: Diagnosis not present

## 2021-04-24 DIAGNOSIS — N1832 Chronic kidney disease, stage 3b: Secondary | ICD-10-CM | POA: Diagnosis not present

## 2021-04-24 DIAGNOSIS — F02811 Dementia in other diseases classified elsewhere, unspecified severity, with agitation: Secondary | ICD-10-CM | POA: Diagnosis not present

## 2021-04-24 DIAGNOSIS — K219 Gastro-esophageal reflux disease without esophagitis: Secondary | ICD-10-CM | POA: Diagnosis not present

## 2021-05-18 DIAGNOSIS — I129 Hypertensive chronic kidney disease with stage 1 through stage 4 chronic kidney disease, or unspecified chronic kidney disease: Secondary | ICD-10-CM | POA: Diagnosis not present

## 2021-05-18 DIAGNOSIS — E7800 Pure hypercholesterolemia, unspecified: Secondary | ICD-10-CM | POA: Diagnosis not present

## 2021-05-18 DIAGNOSIS — G301 Alzheimer's disease with late onset: Secondary | ICD-10-CM | POA: Diagnosis not present

## 2021-05-18 DIAGNOSIS — N1832 Chronic kidney disease, stage 3b: Secondary | ICD-10-CM | POA: Diagnosis not present

## 2021-05-18 DIAGNOSIS — I10 Essential (primary) hypertension: Secondary | ICD-10-CM | POA: Diagnosis not present

## 2021-05-18 DIAGNOSIS — K219 Gastro-esophageal reflux disease without esophagitis: Secondary | ICD-10-CM | POA: Diagnosis not present

## 2021-05-18 DIAGNOSIS — E1122 Type 2 diabetes mellitus with diabetic chronic kidney disease: Secondary | ICD-10-CM | POA: Diagnosis not present

## 2021-06-18 DIAGNOSIS — E7800 Pure hypercholesterolemia, unspecified: Secondary | ICD-10-CM | POA: Diagnosis not present

## 2021-06-18 DIAGNOSIS — E1122 Type 2 diabetes mellitus with diabetic chronic kidney disease: Secondary | ICD-10-CM | POA: Diagnosis not present

## 2021-06-18 DIAGNOSIS — K219 Gastro-esophageal reflux disease without esophagitis: Secondary | ICD-10-CM | POA: Diagnosis not present

## 2021-06-18 DIAGNOSIS — I10 Essential (primary) hypertension: Secondary | ICD-10-CM | POA: Diagnosis not present

## 2021-06-18 DIAGNOSIS — N1832 Chronic kidney disease, stage 3b: Secondary | ICD-10-CM | POA: Diagnosis not present

## 2021-06-18 DIAGNOSIS — I129 Hypertensive chronic kidney disease with stage 1 through stage 4 chronic kidney disease, or unspecified chronic kidney disease: Secondary | ICD-10-CM | POA: Diagnosis not present

## 2021-06-18 DIAGNOSIS — G301 Alzheimer's disease with late onset: Secondary | ICD-10-CM | POA: Diagnosis not present

## 2021-08-16 DIAGNOSIS — E7800 Pure hypercholesterolemia, unspecified: Secondary | ICD-10-CM | POA: Diagnosis not present

## 2021-08-16 DIAGNOSIS — E1122 Type 2 diabetes mellitus with diabetic chronic kidney disease: Secondary | ICD-10-CM | POA: Diagnosis not present

## 2021-08-16 DIAGNOSIS — I10 Essential (primary) hypertension: Secondary | ICD-10-CM | POA: Diagnosis not present

## 2021-09-14 DIAGNOSIS — K219 Gastro-esophageal reflux disease without esophagitis: Secondary | ICD-10-CM | POA: Diagnosis not present

## 2021-09-14 DIAGNOSIS — E7800 Pure hypercholesterolemia, unspecified: Secondary | ICD-10-CM | POA: Diagnosis not present

## 2021-09-14 DIAGNOSIS — N1832 Chronic kidney disease, stage 3b: Secondary | ICD-10-CM | POA: Diagnosis not present

## 2021-09-14 DIAGNOSIS — E1122 Type 2 diabetes mellitus with diabetic chronic kidney disease: Secondary | ICD-10-CM | POA: Diagnosis not present

## 2021-09-14 DIAGNOSIS — I10 Essential (primary) hypertension: Secondary | ICD-10-CM | POA: Diagnosis not present

## 2021-10-17 DIAGNOSIS — E1122 Type 2 diabetes mellitus with diabetic chronic kidney disease: Secondary | ICD-10-CM | POA: Diagnosis not present

## 2021-10-17 DIAGNOSIS — I10 Essential (primary) hypertension: Secondary | ICD-10-CM | POA: Diagnosis not present

## 2021-10-17 DIAGNOSIS — N1832 Chronic kidney disease, stage 3b: Secondary | ICD-10-CM | POA: Diagnosis not present

## 2021-10-17 DIAGNOSIS — K219 Gastro-esophageal reflux disease without esophagitis: Secondary | ICD-10-CM | POA: Diagnosis not present

## 2021-10-17 DIAGNOSIS — E7800 Pure hypercholesterolemia, unspecified: Secondary | ICD-10-CM | POA: Diagnosis not present

## 2021-11-14 DIAGNOSIS — E7800 Pure hypercholesterolemia, unspecified: Secondary | ICD-10-CM | POA: Diagnosis not present

## 2021-11-14 DIAGNOSIS — I129 Hypertensive chronic kidney disease with stage 1 through stage 4 chronic kidney disease, or unspecified chronic kidney disease: Secondary | ICD-10-CM | POA: Diagnosis not present

## 2021-11-14 DIAGNOSIS — K219 Gastro-esophageal reflux disease without esophagitis: Secondary | ICD-10-CM | POA: Diagnosis not present

## 2021-11-14 DIAGNOSIS — I10 Essential (primary) hypertension: Secondary | ICD-10-CM | POA: Diagnosis not present

## 2021-11-14 DIAGNOSIS — E1122 Type 2 diabetes mellitus with diabetic chronic kidney disease: Secondary | ICD-10-CM | POA: Diagnosis not present

## 2021-12-14 DIAGNOSIS — K219 Gastro-esophageal reflux disease without esophagitis: Secondary | ICD-10-CM | POA: Diagnosis not present

## 2021-12-14 DIAGNOSIS — N1832 Chronic kidney disease, stage 3b: Secondary | ICD-10-CM | POA: Diagnosis not present

## 2021-12-14 DIAGNOSIS — I10 Essential (primary) hypertension: Secondary | ICD-10-CM | POA: Diagnosis not present

## 2021-12-14 DIAGNOSIS — I129 Hypertensive chronic kidney disease with stage 1 through stage 4 chronic kidney disease, or unspecified chronic kidney disease: Secondary | ICD-10-CM | POA: Diagnosis not present

## 2021-12-14 DIAGNOSIS — E7800 Pure hypercholesterolemia, unspecified: Secondary | ICD-10-CM | POA: Diagnosis not present

## 2021-12-14 DIAGNOSIS — G301 Alzheimer's disease with late onset: Secondary | ICD-10-CM | POA: Diagnosis not present

## 2021-12-14 DIAGNOSIS — E1122 Type 2 diabetes mellitus with diabetic chronic kidney disease: Secondary | ICD-10-CM | POA: Diagnosis not present

## 2021-12-31 DIAGNOSIS — M109 Gout, unspecified: Secondary | ICD-10-CM | POA: Diagnosis not present

## 2021-12-31 DIAGNOSIS — E7800 Pure hypercholesterolemia, unspecified: Secondary | ICD-10-CM | POA: Diagnosis not present

## 2021-12-31 DIAGNOSIS — Z23 Encounter for immunization: Secondary | ICD-10-CM | POA: Diagnosis not present

## 2021-12-31 DIAGNOSIS — N1832 Chronic kidney disease, stage 3b: Secondary | ICD-10-CM | POA: Diagnosis not present

## 2021-12-31 DIAGNOSIS — I129 Hypertensive chronic kidney disease with stage 1 through stage 4 chronic kidney disease, or unspecified chronic kidney disease: Secondary | ICD-10-CM | POA: Diagnosis not present

## 2021-12-31 DIAGNOSIS — F02818 Dementia in other diseases classified elsewhere, unspecified severity, with other behavioral disturbance: Secondary | ICD-10-CM | POA: Diagnosis not present

## 2021-12-31 DIAGNOSIS — E1122 Type 2 diabetes mellitus with diabetic chronic kidney disease: Secondary | ICD-10-CM | POA: Diagnosis not present

## 2021-12-31 DIAGNOSIS — K219 Gastro-esophageal reflux disease without esophagitis: Secondary | ICD-10-CM | POA: Diagnosis not present

## 2022-01-15 DIAGNOSIS — I10 Essential (primary) hypertension: Secondary | ICD-10-CM | POA: Diagnosis not present

## 2022-01-15 DIAGNOSIS — E1122 Type 2 diabetes mellitus with diabetic chronic kidney disease: Secondary | ICD-10-CM | POA: Diagnosis not present

## 2022-01-15 DIAGNOSIS — E7800 Pure hypercholesterolemia, unspecified: Secondary | ICD-10-CM | POA: Diagnosis not present

## 2022-01-15 DIAGNOSIS — I129 Hypertensive chronic kidney disease with stage 1 through stage 4 chronic kidney disease, or unspecified chronic kidney disease: Secondary | ICD-10-CM | POA: Diagnosis not present

## 2022-01-15 DIAGNOSIS — K219 Gastro-esophageal reflux disease without esophagitis: Secondary | ICD-10-CM | POA: Diagnosis not present

## 2022-03-13 DIAGNOSIS — I10 Essential (primary) hypertension: Secondary | ICD-10-CM | POA: Diagnosis not present

## 2022-03-13 DIAGNOSIS — E7800 Pure hypercholesterolemia, unspecified: Secondary | ICD-10-CM | POA: Diagnosis not present

## 2022-03-13 DIAGNOSIS — E1122 Type 2 diabetes mellitus with diabetic chronic kidney disease: Secondary | ICD-10-CM | POA: Diagnosis not present

## 2022-03-13 DIAGNOSIS — N1832 Chronic kidney disease, stage 3b: Secondary | ICD-10-CM | POA: Diagnosis not present

## 2023-06-13 DIAGNOSIS — N184 Chronic kidney disease, stage 4 (severe): Secondary | ICD-10-CM | POA: Diagnosis not present

## 2023-06-13 DIAGNOSIS — R809 Proteinuria, unspecified: Secondary | ICD-10-CM | POA: Diagnosis not present

## 2023-06-13 DIAGNOSIS — I129 Hypertensive chronic kidney disease with stage 1 through stage 4 chronic kidney disease, or unspecified chronic kidney disease: Secondary | ICD-10-CM | POA: Diagnosis not present

## 2023-06-13 DIAGNOSIS — E1122 Type 2 diabetes mellitus with diabetic chronic kidney disease: Secondary | ICD-10-CM | POA: Diagnosis not present

## 2023-06-13 DIAGNOSIS — N179 Acute kidney failure, unspecified: Secondary | ICD-10-CM | POA: Diagnosis not present

## 2023-06-13 DIAGNOSIS — N2581 Secondary hyperparathyroidism of renal origin: Secondary | ICD-10-CM | POA: Diagnosis not present

## 2023-06-13 DIAGNOSIS — E876 Hypokalemia: Secondary | ICD-10-CM | POA: Diagnosis not present

## 2023-06-25 DIAGNOSIS — N3020 Other chronic cystitis without hematuria: Secondary | ICD-10-CM | POA: Diagnosis not present

## 2023-10-18 DIAGNOSIS — E1122 Type 2 diabetes mellitus with diabetic chronic kidney disease: Secondary | ICD-10-CM | POA: Diagnosis not present

## 2023-10-18 DIAGNOSIS — E7800 Pure hypercholesterolemia, unspecified: Secondary | ICD-10-CM | POA: Diagnosis not present

## 2023-10-18 DIAGNOSIS — N1832 Chronic kidney disease, stage 3b: Secondary | ICD-10-CM | POA: Diagnosis not present

## 2023-10-18 DIAGNOSIS — N189 Chronic kidney disease, unspecified: Secondary | ICD-10-CM | POA: Diagnosis not present

## 2023-10-30 DIAGNOSIS — B351 Tinea unguium: Secondary | ICD-10-CM | POA: Diagnosis not present

## 2023-11-17 DIAGNOSIS — E7800 Pure hypercholesterolemia, unspecified: Secondary | ICD-10-CM | POA: Diagnosis not present

## 2023-11-17 DIAGNOSIS — N1832 Chronic kidney disease, stage 3b: Secondary | ICD-10-CM | POA: Diagnosis not present

## 2023-11-17 DIAGNOSIS — N189 Chronic kidney disease, unspecified: Secondary | ICD-10-CM | POA: Diagnosis not present

## 2023-11-17 DIAGNOSIS — E1122 Type 2 diabetes mellitus with diabetic chronic kidney disease: Secondary | ICD-10-CM | POA: Diagnosis not present

## 2023-12-18 DIAGNOSIS — N1832 Chronic kidney disease, stage 3b: Secondary | ICD-10-CM | POA: Diagnosis not present

## 2023-12-18 DIAGNOSIS — E1122 Type 2 diabetes mellitus with diabetic chronic kidney disease: Secondary | ICD-10-CM | POA: Diagnosis not present

## 2023-12-18 DIAGNOSIS — N189 Chronic kidney disease, unspecified: Secondary | ICD-10-CM | POA: Diagnosis not present

## 2023-12-18 DIAGNOSIS — E7800 Pure hypercholesterolemia, unspecified: Secondary | ICD-10-CM | POA: Diagnosis not present

## 2024-01-18 DIAGNOSIS — E7800 Pure hypercholesterolemia, unspecified: Secondary | ICD-10-CM | POA: Diagnosis not present

## 2024-01-18 DIAGNOSIS — N1832 Chronic kidney disease, stage 3b: Secondary | ICD-10-CM | POA: Diagnosis not present

## 2024-01-18 DIAGNOSIS — N189 Chronic kidney disease, unspecified: Secondary | ICD-10-CM | POA: Diagnosis not present

## 2024-01-18 DIAGNOSIS — E1122 Type 2 diabetes mellitus with diabetic chronic kidney disease: Secondary | ICD-10-CM | POA: Diagnosis not present

## 2024-01-22 DIAGNOSIS — M109 Gout, unspecified: Secondary | ICD-10-CM | POA: Diagnosis not present

## 2024-01-22 DIAGNOSIS — E1122 Type 2 diabetes mellitus with diabetic chronic kidney disease: Secondary | ICD-10-CM | POA: Diagnosis not present

## 2024-01-22 DIAGNOSIS — I129 Hypertensive chronic kidney disease with stage 1 through stage 4 chronic kidney disease, or unspecified chronic kidney disease: Secondary | ICD-10-CM | POA: Diagnosis not present

## 2024-01-22 DIAGNOSIS — F17210 Nicotine dependence, cigarettes, uncomplicated: Secondary | ICD-10-CM | POA: Diagnosis not present

## 2024-01-22 DIAGNOSIS — E7800 Pure hypercholesterolemia, unspecified: Secondary | ICD-10-CM | POA: Diagnosis not present

## 2024-01-22 DIAGNOSIS — Z79899 Other long term (current) drug therapy: Secondary | ICD-10-CM | POA: Diagnosis not present

## 2024-01-22 DIAGNOSIS — D696 Thrombocytopenia, unspecified: Secondary | ICD-10-CM | POA: Diagnosis not present

## 2024-01-22 DIAGNOSIS — K219 Gastro-esophageal reflux disease without esophagitis: Secondary | ICD-10-CM | POA: Diagnosis not present

## 2024-01-22 DIAGNOSIS — Z23 Encounter for immunization: Secondary | ICD-10-CM | POA: Diagnosis not present

## 2024-01-22 DIAGNOSIS — E559 Vitamin D deficiency, unspecified: Secondary | ICD-10-CM | POA: Diagnosis not present

## 2024-01-22 DIAGNOSIS — I739 Peripheral vascular disease, unspecified: Secondary | ICD-10-CM | POA: Diagnosis not present

## 2024-01-22 DIAGNOSIS — N1832 Chronic kidney disease, stage 3b: Secondary | ICD-10-CM | POA: Diagnosis not present

## 2024-01-22 DIAGNOSIS — Z0000 Encounter for general adult medical examination without abnormal findings: Secondary | ICD-10-CM | POA: Diagnosis not present

## 2024-02-10 DIAGNOSIS — N2581 Secondary hyperparathyroidism of renal origin: Secondary | ICD-10-CM | POA: Diagnosis not present

## 2024-02-10 DIAGNOSIS — E1122 Type 2 diabetes mellitus with diabetic chronic kidney disease: Secondary | ICD-10-CM | POA: Diagnosis not present

## 2024-02-10 DIAGNOSIS — I129 Hypertensive chronic kidney disease with stage 1 through stage 4 chronic kidney disease, or unspecified chronic kidney disease: Secondary | ICD-10-CM | POA: Diagnosis not present

## 2024-02-10 DIAGNOSIS — N184 Chronic kidney disease, stage 4 (severe): Secondary | ICD-10-CM | POA: Diagnosis not present

## 2024-02-17 DIAGNOSIS — E7800 Pure hypercholesterolemia, unspecified: Secondary | ICD-10-CM | POA: Diagnosis not present

## 2024-02-17 DIAGNOSIS — N1832 Chronic kidney disease, stage 3b: Secondary | ICD-10-CM | POA: Diagnosis not present

## 2024-02-17 DIAGNOSIS — E1122 Type 2 diabetes mellitus with diabetic chronic kidney disease: Secondary | ICD-10-CM | POA: Diagnosis not present

## 2024-02-17 DIAGNOSIS — N189 Chronic kidney disease, unspecified: Secondary | ICD-10-CM | POA: Diagnosis not present
